# Patient Record
Sex: Male | Born: 1955 | Race: Black or African American | Hispanic: No | Marital: Married | Smoking: Former smoker
Health system: Southern US, Community
[De-identification: ages and names within clinical notes are randomized; demographics above are authoritative.]

## PROBLEM LIST (undated history)

## (undated) DIAGNOSIS — E119 Type 2 diabetes mellitus without complications: Secondary | ICD-10-CM

## (undated) DIAGNOSIS — I10 Essential (primary) hypertension: Secondary | ICD-10-CM

## (undated) DIAGNOSIS — IMO0001 Reserved for inherently not codable concepts without codable children: Secondary | ICD-10-CM

## (undated) HISTORY — PX: NO PAST SURGERIES: SHX2092

## (undated) HISTORY — DX: Essential (primary) hypertension: I10

---

## 2005-03-17 ENCOUNTER — Emergency Department: Payer: Self-pay | Admitting: Emergency Medicine

## 2009-05-31 ENCOUNTER — Emergency Department: Payer: Self-pay | Admitting: Emergency Medicine

## 2010-06-15 ENCOUNTER — Ambulatory Visit: Payer: Self-pay | Admitting: Gastroenterology

## 2010-11-16 ENCOUNTER — Ambulatory Visit: Payer: Self-pay | Admitting: Internal Medicine

## 2012-08-01 ENCOUNTER — Ambulatory Visit: Payer: Self-pay

## 2013-12-16 ENCOUNTER — Ambulatory Visit: Payer: Self-pay

## 2015-06-15 ENCOUNTER — Encounter: Payer: Self-pay | Admitting: *Deleted

## 2015-06-21 ENCOUNTER — Encounter: Payer: Self-pay | Admitting: General Surgery

## 2015-06-21 ENCOUNTER — Ambulatory Visit (INDEPENDENT_AMBULATORY_CARE_PROVIDER_SITE_OTHER): Payer: BLUE CROSS/BLUE SHIELD | Admitting: General Surgery

## 2015-06-21 VITALS — BP 128/70 | HR 72 | Resp 14 | Ht 68.0 in | Wt 221.0 lb

## 2015-06-21 DIAGNOSIS — K4090 Unilateral inguinal hernia, without obstruction or gangrene, not specified as recurrent: Secondary | ICD-10-CM

## 2015-06-21 NOTE — Patient Instructions (Addendum)

## 2015-06-21 NOTE — Progress Notes (Signed)
Patient ID: Lawrence Ross, male   DOB: 1956/02/05, 59 y.o.   MRN: [id number]  Chief Complaint  Patient presents with  . Other    right inguinal hernia    HPI READE Lawrence Ross is a 59 y.o. male here for evaluation of right inguinal hernia. He had an ultrasound done in office on 06/10/15. He reports that this first started on 06/07/15 when he picked up a heavy generator with another individual and they let go of it. He states the next day he noticed a bulging in the right groin area. He states that since he has some pain with coughing or sneezing or when he is standing for long periods. He also feels a thumping pain in his right leg with standing for long periods also. He denies any problems with using the bathroom. He is here today with his wife.  HPI  Past Medical History  Diagnosis Date  . Hypertension     No past surgical history on file.  Family History  Problem Relation Age of Onset  . Diabetes Father   . Hypertension Father   . Fibromyalgia Mother   . Hypertension Mother   . Hypertension Brother     Social History Social History  Substance Use Topics  . Smoking status: Former Smoker -- 20 years    Quit date: 10/24/2003  . Smokeless tobacco: Never Used  . Alcohol Use: 0.0 oz/week    0 Standard drinks or equivalent per week    No Known Allergies  Current Outpatient Prescriptions  Medication Sig Dispense Refill  . amLODipine (NORVASC) 10 MG tablet Take 10 mg by mouth daily.  0  . bisoprolol (ZEBETA) 5 MG tablet Take 5 mg by mouth daily.  0  . diclofenac (VOLTAREN) 75 MG EC tablet Take 75 mg by mouth as needed.  0  . hydrochlorothiazide (HYDRODIURIL) 25 MG tablet Take 25 mg by mouth daily.  0   No current facility-administered medications for this visit.    Review of Systems Review of Systems  Constitutional: Negative.   Respiratory: Negative.   Cardiovascular: Negative.   Gastrointestinal: Negative.     Blood pressure 128/70, pulse 72, resp. rate 14, height 5'  8" (1.727 m), weight 221 lb (100.245 kg).  Physical Exam Physical Exam  Constitutional: He is oriented to person, place, and time. He appears well-developed and well-nourished.  Eyes: Conjunctivae are normal. No scleral icterus.  Neck: Neck supple.  Cardiovascular: Normal rate, regular rhythm and normal heart sounds.   Pulmonary/Chest: Effort normal and breath sounds normal.  Abdominal: Soft. Normal appearance and bowel sounds are normal. There is no tenderness. A hernia is present. Hernia confirmed positive in the right inguinal area. Hernia confirmed negative in the left inguinal area.  Genitourinary: Testes normal and penis normal.     Lymphadenopathy:    He has no cervical adenopathy.       Right: No inguinal adenopathy present.       Left: No inguinal adenopathy present.  Neurological: He is alert and oriented to person, place, and time.  Skin: Skin is warm and dry.  Psychiatric: He has a normal mood and affect.    Data Reviewed Ultrasound from his PCP office dated 06/10/2015 reports apparent bowel containing right inguinal hernia. Clinical correlation recommended. Laboratory studies dated 02/05/2015 showed a blood sugar 138, normal electrolytes, creatinine 0.86 with an estimated GFR of 111. Normal liver function studies. Hemoglobin 14.7 with an MCV of 78, white blood cell count of 8800. Elevated  triglycerides, low HDL, PSA 0.4. TSH 2.36  Assessment    Right inguinal hernia, no evidence of incarceration.    Plan    Indication for elective repair reviewed. The patient may continue his anti-inflammatory prior to surgery.    Hernia precautions and incarceration were discussed with the patient. If they develop symptoms of an incarcerated hernia, they were encouraged to seek prompt medical attention. I have recommended repair of the hernia using mesh on an outpatient basis in the near future. The risk of infection was reviewed. The role of prosthetic mesh to minimize the risk of  recurrence was reviewed.  Patient's surgery has been scheduled for 06-25-15 at [facility].  PCP: Rod Mae 06/21/2015, 4:08 PM

## 2015-06-21 NOTE — H&P (Addendum)
Patient ID: Lawrence Ross, male DOB: 1956-05-26, 59 y.o. MRN: [id number]  Chief Complaint   Patient presents with   .  Other     right inguinal hernia    HPI  Lawrence Ross is a 59 y.o. male here for evaluation of right inguinal hernia. He had an ultrasound done in office on 06/10/15. He reports that this first started on 06/07/15 when he picked up a heavy generator with another individual and they let go of it. He states the next day he noticed a bulging in the right groin area. He states that since he has some pain with coughing or sneezing or when he is standing for long periods. He also feels a thumping pain in his right leg with standing for long periods also. He denies any problems with using the bathroom. He is here today with his wife.  HPI  Past Medical History   Diagnosis  Date   .  Hypertension     No past surgical history on file.  Family History   Problem  Relation  Age of Onset   .  Diabetes  Father    .  Hypertension  Father    .  Fibromyalgia  Mother    .  Hypertension  Mother    .  Hypertension  Brother     Social History  Social History   Substance Use Topics   .  Smoking status:  Former Smoker -- 20 years     Quit date:  10/24/2003   .  Smokeless tobacco:  Never Used   .  Alcohol Use:  0.0 oz/week     0 Standard drinks or equivalent per week    No Known Allergies  Current Outpatient Prescriptions   Medication  Sig  Dispense  Refill   .  amLODipine (NORVASC) 10 MG tablet  Take 10 mg by mouth daily.   0   .  bisoprolol (ZEBETA) 5 MG tablet  Take 5 mg by mouth daily.   0   .  diclofenac (VOLTAREN) 75 MG EC tablet  Take 75 mg by mouth as needed.   0   .  hydrochlorothiazide (HYDRODIURIL) 25 MG tablet  Take 25 mg by mouth daily.   0    No current facility-administered medications for this visit.    Review of Systems  Review of Systems  Constitutional: Negative.  Respiratory: Negative.  Cardiovascular: Negative.  Gastrointestinal: Negative.   Blood pressure  128/70, pulse 72, resp. rate 14, height  (1.727 m), weight 221 lb (100.245 kg).  Physical Exam  Physical Exam  Constitutional: He is oriented to person, place, and time. He appears well-developed and well-nourished.  Eyes: Conjunctivae are normal. No scleral icterus.  Neck: Neck supple.  Cardiovascular: Normal rate, regular rhythm and normal heart sounds.  Pulmonary/Chest: Effort normal and breath sounds normal.  Abdominal: Soft. Normal appearance and bowel sounds are normal. There is no tenderness. A hernia is present. Hernia confirmed positive in the right inguinal area. Hernia confirmed negative in the left inguinal area.  Genitourinary: Testes normal and penis normal.     Lymphadenopathy:  He has no cervical adenopathy.  Right: No inguinal adenopathy present.  Left: No inguinal adenopathy present.  Neurological: He is alert and oriented to person, place, and time.  Skin: Skin is warm and dry.  Psychiatric: He has a normal mood and affect.   Data Reviewed  Ultrasound from his PCP office dated 06/10/2015 reports apparent bowel containing right  inguinal hernia. Clinical correlation recommended.  Laboratory studies dated 02/05/2015 showed a blood sugar 138, normal electrolytes, creatinine 0.86 with an estimated GFR of 111. Normal liver function studies. Hemoglobin 14.7 with an MCV of 78, white blood cell count of 8800. Elevated triglycerides, low HDL, PSA 0.4. TSH 2.36 Assessment   Right inguinal hernia, no evidence of incarceration.   Plan   Indication for elective repair reviewed. The patient may continue his anti-inflammatory prior to surgery.   Hernia precautions and incarceration were discussed with the patient. If they develop symptoms of an incarcerated hernia, they were encouraged to seek prompt medical attention.  I have recommended repair of the hernia using mesh on an outpatient basis in the near future. The risk of infection was reviewed. The role of prosthetic mesh  to minimize the risk of recurrence was reviewed.  Patient's surgery has been scheduled for 06-25-15 at [facility].  PCP: Rod Mae

## 2015-06-23 ENCOUNTER — Encounter: Payer: Self-pay | Admitting: *Deleted

## 2015-06-23 ENCOUNTER — Other Ambulatory Visit: Payer: Self-pay

## 2015-06-23 DIAGNOSIS — Z87891 Personal history of nicotine dependence: Secondary | ICD-10-CM | POA: Diagnosis not present

## 2015-06-23 DIAGNOSIS — E119 Type 2 diabetes mellitus without complications: Secondary | ICD-10-CM | POA: Diagnosis not present

## 2015-06-23 DIAGNOSIS — I10 Essential (primary) hypertension: Secondary | ICD-10-CM | POA: Diagnosis not present

## 2015-06-23 DIAGNOSIS — K4090 Unilateral inguinal hernia, without obstruction or gangrene, not specified as recurrent: Secondary | ICD-10-CM | POA: Diagnosis present

## 2015-06-23 DIAGNOSIS — IMO0001 Reserved for inherently not codable concepts without codable children: Secondary | ICD-10-CM

## 2015-06-23 HISTORY — DX: Reserved for inherently not codable concepts without codable children: IMO0001

## 2015-06-23 NOTE — Patient Instructions (Signed)
  Your procedure is scheduled on: 06-25-15 Report to [facility] 2ND FLOOR To find out your arrival time please call [phone] between 1PM - 3PM on 06-24-15  Remember: Instructions that are not followed completely may result in serious medical risk, up to and including death, or upon the discretion of your surgeon and anesthesiologist your surgery may need to be rescheduled.    _X__ 1. Do not eat food or drink liquids after midnight. No gum chewing or hard candies.     _X___ 2. No Alcohol for 24 hours before or after surgery.   ____ 3. Bring all medications with you on the day of surgery if instructed.    _X___ 4. Notify your doctor if there is any change in your medical condition     (cold, fever, infections).     Do not wear jewelry, make-up, hairpins, clips or nail polish.  Do not wear lotions, powders, or perfumes. You may wear deodorant.  Do not shave 48 hours prior to surgery. Men may shave face and neck.  Do not bring valuables to the hospital.    [facility] is not responsible for any belongings or valuables.               Contacts, dentures or bridgework may not be worn into surgery.  Leave your suitcase in the car. After surgery it may be brought to your room.  For patients admitted to the hospital, discharge time is determined by your  treatment team.   Patients discharged the day of surgery will not be allowed to drive home.   Please read over the following fact sheets that you were given:      __X__ Take these medicines the morning of surgery with A SIP OF WATER:    1. BISOPROLOL  2. AMLODIPINE  3.   4.  5.  6.  ____ Fleet Enema (as directed)   __X__ Use CHG Soap as directed  ____ Use inhalers on the day of surgery  ____ Stop metformin 2 days prior to surgery    ____ Take 1/2 of usual insulin dose the night before surgery and none on the morning of surgery.   ____ Stop Coumadin/Plavix/aspirin-N/A  ____ Stop Anti-inflammatories-OK TO  CONTINUE DICLOFENAC PER DR BYRNETT   ____ Stop supplements until after surgery.    ____ Bring C-Pap to the hospital.

## 2015-06-24 ENCOUNTER — Encounter: Payer: Self-pay | Admitting: *Deleted

## 2015-06-24 ENCOUNTER — Encounter
Admission: RE | Admit: 2015-06-24 | Discharge: 2015-06-24 | Disposition: A | Discharge status: Home / Self Care | Payer: BLUE CROSS/BLUE SHIELD | Source: Ambulatory Visit | Attending: Anesthesiology | Admitting: Anesthesiology

## 2015-06-24 DIAGNOSIS — I10 Essential (primary) hypertension: Secondary | ICD-10-CM | POA: Diagnosis not present

## 2015-06-24 DIAGNOSIS — K4090 Unilateral inguinal hernia, without obstruction or gangrene, not specified as recurrent: Secondary | ICD-10-CM | POA: Diagnosis not present

## 2015-06-24 LAB — POTASSIUM: POTASSIUM: 3.7 mmol/L (ref 3.5–5.1)

## 2015-06-25 ENCOUNTER — Ambulatory Visit
Admission: RE | Admit: 2015-06-25 | Discharge: 2015-06-25 | Disposition: A | Discharge status: Home / Self Care | Payer: BLUE CROSS/BLUE SHIELD | Source: Ambulatory Visit | Attending: General Surgery | Admitting: General Surgery

## 2015-06-25 ENCOUNTER — Ambulatory Visit: Payer: BLUE CROSS/BLUE SHIELD | Admitting: Anesthesiology

## 2015-06-25 ENCOUNTER — Encounter
Admission: RE | Disposition: A | Discharge status: Home / Self Care | Payer: Self-pay | Source: Ambulatory Visit | Attending: General Surgery

## 2015-06-25 ENCOUNTER — Encounter: Payer: Self-pay | Admitting: *Deleted

## 2015-06-25 DIAGNOSIS — K4090 Unilateral inguinal hernia, without obstruction or gangrene, not specified as recurrent: Secondary | ICD-10-CM | POA: Diagnosis not present

## 2015-06-25 DIAGNOSIS — I10 Essential (primary) hypertension: Secondary | ICD-10-CM | POA: Insufficient documentation

## 2015-06-25 DIAGNOSIS — E119 Type 2 diabetes mellitus without complications: Secondary | ICD-10-CM | POA: Insufficient documentation

## 2015-06-25 DIAGNOSIS — Z87891 Personal history of nicotine dependence: Secondary | ICD-10-CM | POA: Insufficient documentation

## 2015-06-25 HISTORY — DX: Type 2 diabetes mellitus without complications: E11.9

## 2015-06-25 HISTORY — DX: Reserved for inherently not codable concepts without codable children: IMO0001

## 2015-06-25 HISTORY — PX: HERNIA REPAIR: SHX51

## 2015-06-25 HISTORY — PX: INGUINAL HERNIA REPAIR: SHX194

## 2015-06-25 SURGERY — REPAIR, HERNIA, INGUINAL, ADULT
Anesthesia: General | Laterality: Right | Wound class: Clean

## 2015-06-25 MED ORDER — FENTANYL CITRATE (PF) 100 MCG/2ML IJ SOLN: 25.0000 ug | INTRAMUSCULAR | Status: DC | PRN

## 2015-06-25 MED ORDER — ACETAMINOPHEN 10 MG/ML IV SOLN
INTRAVENOUS | Status: AC
  Filled 2015-06-25: qty 100

## 2015-06-25 MED ORDER — KETOROLAC TROMETHAMINE 30 MG/ML IJ SOLN
INTRAMUSCULAR | Status: DC | PRN
  Administered 2015-06-25: 30 mg

## 2015-06-25 MED ORDER — FAMOTIDINE 20 MG PO TABS
20.0000 mg | ORAL_TABLET | Freq: Once | ORAL | Status: AC
  Administered 2015-06-25: 20 mg via ORAL

## 2015-06-25 MED ORDER — FENTANYL CITRATE (PF) 100 MCG/2ML IJ SOLN
INTRAMUSCULAR | Status: DC | PRN
  Administered 2015-06-25: 50 ug via INTRAVENOUS

## 2015-06-25 MED ORDER — GLYCOPYRROLATE 0.2 MG/ML IJ SOLN
INTRAMUSCULAR | Status: DC | PRN
  Administered 2015-06-25: 0.2 mg via INTRAVENOUS

## 2015-06-25 MED ORDER — DEXAMETHASONE SODIUM PHOSPHATE 4 MG/ML IJ SOLN
INTRAMUSCULAR | Status: DC | PRN
  Administered 2015-06-25: 10 mg via INTRAVENOUS

## 2015-06-25 MED ORDER — FAMOTIDINE 20 MG PO TABS
ORAL_TABLET | ORAL | Status: AC
  Administered 2015-06-25: 20 mg via ORAL
  Filled 2015-06-25: qty 1

## 2015-06-25 MED ORDER — ONDANSETRON HCL 4 MG/2ML IJ SOLN: 4.0000 mg | Freq: Once | INTRAMUSCULAR | Status: DC | PRN

## 2015-06-25 MED ORDER — HYDROCODONE-ACETAMINOPHEN 5-325 MG PO TABS
ORAL_TABLET | ORAL | Status: AC
  Administered 2015-06-25: 1 via ORAL
  Filled 2015-06-25: qty 1

## 2015-06-25 MED ORDER — ACETAMINOPHEN 10 MG/ML IV SOLN
INTRAVENOUS | Status: DC | PRN
  Administered 2015-06-25: 1000 mg via INTRAVENOUS

## 2015-06-25 MED ORDER — HYDROCODONE-ACETAMINOPHEN 5-325 MG PO TABS
1.0000 | ORAL_TABLET | ORAL | Status: DC | PRN
  Administered 2015-06-25: 1 via ORAL

## 2015-06-25 MED ORDER — HYDROCODONE-ACETAMINOPHEN 5-325 MG PO TABS: 1.0000 | ORAL_TABLET | ORAL | Status: AC | PRN

## 2015-06-25 MED ORDER — LIDOCAINE HCL (CARDIAC) 20 MG/ML IV SOLN
INTRAVENOUS | Status: DC | PRN
  Administered 2015-06-25: 100 mg via INTRAVENOUS

## 2015-06-25 MED ORDER — CEFAZOLIN SODIUM-DEXTROSE 2-3 GM-% IV SOLR
2.0000 g | INTRAVENOUS | Status: AC
  Administered 2015-06-25: 2 g via INTRAVENOUS

## 2015-06-25 MED ORDER — ONDANSETRON HCL 4 MG/2ML IJ SOLN
INTRAMUSCULAR | Status: DC | PRN
  Administered 2015-06-25: 4 mg via INTRAVENOUS

## 2015-06-25 MED ORDER — BUPIVACAINE-EPINEPHRINE (PF) 0.5% -1:200000 IJ SOLN
INTRAMUSCULAR | Status: DC | PRN
  Administered 2015-06-25: 30 mL

## 2015-06-25 MED ORDER — SODIUM CHLORIDE 0.9 % IV SOLN
INTRAVENOUS | Status: DC
Start: 2015-06-25 — End: 2015-06-25
  Administered 2015-06-25: 07:00:00 via INTRAVENOUS

## 2015-06-25 MED ORDER — MIDAZOLAM HCL 2 MG/2ML IJ SOLN
INTRAMUSCULAR | Status: DC | PRN
  Administered 2015-06-25: 2 mg via INTRAVENOUS

## 2015-06-25 MED ORDER — BUPIVACAINE-EPINEPHRINE (PF) 0.5% -1:200000 IJ SOLN
INTRAMUSCULAR | Status: AC
  Filled 2015-06-25: qty 30

## 2015-06-25 MED ORDER — PROPOFOL 10 MG/ML IV BOLUS
INTRAVENOUS | Status: DC | PRN
  Administered 2015-06-25: 200 mg via INTRAVENOUS

## 2015-06-25 MED ORDER — CEFAZOLIN SODIUM-DEXTROSE 2-3 GM-% IV SOLR
INTRAVENOUS | Status: AC
  Administered 2015-06-25: 2 g via INTRAVENOUS
  Filled 2015-06-25: qty 50

## 2015-06-25 SURGICAL SUPPLY — 34 items
BENZOIN TINCTURE PRP APPL 2/3 (GAUZE/BANDAGES/DRESSINGS) ×3 IMPLANT
BLADE SURG 15 STRL SS SAFETY (BLADE) ×3 IMPLANT
CANISTER SUCT 1200ML W/VALVE (MISCELLANEOUS) ×3 IMPLANT
CHLORAPREP W/TINT 26ML (MISCELLANEOUS) ×3 IMPLANT
CLOSURE WOUND 1/2 X4 (GAUZE/BANDAGES/DRESSINGS) ×1
DECANTER SPIKE VIAL GLASS SM (MISCELLANEOUS) IMPLANT
DRAIN PENROSE 1/4X12 LTX (DRAIN) ×3 IMPLANT
DRAPE LAPAROTOMY 100X77 ABD (DRAPES) ×3 IMPLANT
DRESSING TELFA 4X3 1S ST N-ADH (GAUZE/BANDAGES/DRESSINGS) ×3 IMPLANT
DRSG TEGADERM 4X4.75 (GAUZE/BANDAGES/DRESSINGS) ×3 IMPLANT
DRSG TELFA 3X8 NADH (GAUZE/BANDAGES/DRESSINGS) ×3 IMPLANT
GLOVE BIO SURGEON STRL SZ7.5 (GLOVE) ×3 IMPLANT
GLOVE INDICATOR 8.0 STRL GRN (GLOVE) ×3 IMPLANT
GOWN STRL REUS W/ TWL LRG LVL3 (GOWN DISPOSABLE) ×2 IMPLANT
GOWN STRL REUS W/TWL LRG LVL3 (GOWN DISPOSABLE) ×4
KIT RM TURNOVER STRD PROC AR (KITS) ×3 IMPLANT
LABEL OR SOLS (LABEL) ×3 IMPLANT
MESH HERNIA SYS ULTRAPRO LRG (Mesh General) ×3 IMPLANT
NDL SAFETY 22GX1.5 (NEEDLE) ×6 IMPLANT
NEEDLE HYPO 25X1 1.5 SAFETY (NEEDLE) ×3 IMPLANT
PACK BASIN MINOR ARMC (MISCELLANEOUS) ×3 IMPLANT
PAD GROUND ADULT SPLIT (MISCELLANEOUS) ×3 IMPLANT
STRIP CLOSURE SKIN 1/2X4 (GAUZE/BANDAGES/DRESSINGS) ×2 IMPLANT
SUT SURGILON 0 BLK (SUTURE) ×6 IMPLANT
SUT VIC AB 2-0 SH 27 (SUTURE) ×2
SUT VIC AB 2-0 SH 27XBRD (SUTURE) ×1 IMPLANT
SUT VIC AB 3-0 54X BRD REEL (SUTURE) ×1 IMPLANT
SUT VIC AB 3-0 BRD 54 (SUTURE) ×2
SUT VIC AB 3-0 SH 27 (SUTURE) ×2
SUT VIC AB 3-0 SH 27X BRD (SUTURE) ×1 IMPLANT
SUT VIC AB 4-0 FS2 27 (SUTURE) ×3 IMPLANT
SWABSTK COMLB BENZOIN TINCTURE (MISCELLANEOUS) ×3 IMPLANT
SYR 3ML LL SCALE MARK (SYRINGE) ×3 IMPLANT
SYR CONTROL 10ML (SYRINGE) ×3 IMPLANT

## 2015-06-25 NOTE — H&P (Signed)
Mild URI symptoms earlier in the week have resolved. Lungs:Clear. Cardio: RR. Plan: Right inguinal hernia repair.

## 2015-06-25 NOTE — Anesthesia Procedure Notes (Signed)
Procedure Name: LMA Insertion Date/Time: 06/25/2015 7:33 AM Performed by: Stormy Fabian Pre-anesthesia Checklist: Patient identified, Patient being monitored, Timeout performed, Emergency Drugs available and Suction available Patient Re-evaluated:Patient Re-evaluated prior to inductionOxygen Delivery Method: Circle system utilized Preoxygenation: Pre-oxygenation with 100% oxygen Intubation Type: IV induction Ventilation: Mask ventilation without difficulty LMA: LMA inserted LMA Size: 4.5 Tube type: Oral Number of attempts: 1 Placement Confirmation: positive ETCO2 and breath sounds checked- equal and bilateral Tube secured with: Tape Dental Injury: Teeth and Oropharynx as per pre-operative assessment

## 2015-06-25 NOTE — Op Note (Signed)
Preoperative diagnosis: Symptomatic right inguinal hernia.  Postoperative diagnosis: Same.  Operative procedure: Right inguinal hernia repair, indirect with Ultra Pro mesh, large.  Operating surgeon: Lane Hacker, M.D.  Anesthesia: Gen., Marcaine 0.5% with 1-200,000 of epinephrine, 30 mL; portal 30 mg.  Estimated blood loss: Less than 5 mL.  Clinical note: This 59 year old male developed symptomatically right inguinal hernia. He is made for elective repair.  Operative note: The patient received Kefzol prior to procedure and air was removed from the surgical site with clippers. After induction of general anesthesia the area was prepped with DuraPrep and draped. A 5 cm skin incision along the anticipated course inguinal canal was carried out to skin septum is tissue with hemostasis achieved bilateral cautery and 30 Vicryls ties. The external oblique was opened in the direction of its fibers. A lipoma of the cord was excised and discarded. A sizable indirect hernia sac with evidence of inflammation was noted and dissected free of the cord structures. This was carried back into the preperitoneal space. Palpation here showed a 1 cm firm external iliac lymph node that was removed and sent for routine histology. The preperitoneal space was cleared and a large Ultra Pro mesh was smoothed into position. A lateral slit was made for cord passage and closed with interrupted 0 Surgilon sutures. The mesh was anchored to the inguinal ligament with interrupted oh Surgilon. The medial and superior borders were anchored to the transverse abdominis aponeurosis in a similar fashion. The ilioinguinal and iliohypogastric nerves were identified and protected. Toradol was placed into the wound. Field block anesthesia was placed for postoperative analgesia. The external oblique was closed with a running 2-0 Vicryls suture. The Scarpa's fascia was closed with a running 3-0 Vicryls suture. The skin was closed with a running  4-0 Vicryls suture. Benzoin, Steri-Strips, Telfa and Tegaderm dressings were applied.  The patient tolerated the procedure well and was taken to recovery in stable condition

## 2015-06-25 NOTE — Anesthesia Preprocedure Evaluation (Addendum)
Anesthesia Evaluation  Patient identified by MRN, date of birth, ID band Patient awake    Reviewed: Allergy & Precautions, NPO status , Patient's Chart, lab work & pertinent test results, reviewed documented beta blocker date and time   Airway Mallampati: III  TM Distance: >3 FB     Dental  (+) Chipped   Pulmonary former smoker,          Cardiovascular hypertension, Pt. on medications and Pt. on home beta blockers     Neuro/Psych    GI/Hepatic   Endo/Other  diabetes, Type 2  Renal/GU      Musculoskeletal   Abdominal   Peds  Hematology   Anesthesia Other Findings Had cold last week - ok now.  Reproductive/Obstetrics                            Anesthesia Physical Anesthesia Plan  ASA: III  Anesthesia Plan: General   Post-op Pain Management:    Induction: Intravenous  Airway Management Planned: LMA  Additional Equipment:   Intra-op Plan:   Post-operative Plan:   Informed Consent: I have reviewed the patients History and Physical, chart, labs and discussed the procedure including the risks, benefits and alternatives for the proposed anesthesia with the patient or authorized representative who has indicated his/her understanding and acceptance.     Plan Discussed with: CRNA  Anesthesia Plan Comments:         Anesthesia Quick Evaluation

## 2015-06-25 NOTE — Discharge Instructions (Signed)

## 2015-06-25 NOTE — Transfer of Care (Signed)
Immediate Anesthesia Transfer of Care Note  Patient: Lawrence Ross  Procedure(s) Performed: Procedure(s): RIGHT INGUINAL HERNIA REPAIR ADULT (Right)  Patient Location: PACU  Anesthesia Type:General  Level of Consciousness: sedated  Airway & Oxygen Therapy: Patient Spontanous Breathing and Patient connected to face mask oxygen  Post-op Assessment: Report given to RN and Post -op Vital signs reviewed and stable  Post vital signs: Reviewed and stable  Last Vitals:  Filed Vitals:   06/25/15 0847  BP: 120/72  Pulse: 78  Temp: 37 C  Resp: 15    Complications: No apparent anesthesia complications

## 2015-06-25 NOTE — Anesthesia Postprocedure Evaluation (Signed)
  Anesthesia Post-op Note  Patient: Lawrence Ross  Procedure(s) Performed: Procedure(s): RIGHT INGUINAL HERNIA REPAIR ADULT (Right)  Anesthesia type:General  Patient location: PACU  Post pain: Pain level controlled  Post assessment: Post-op Vital signs reviewed, Patient's Cardiovascular Status Stable, Respiratory Function Stable, Patent Airway and No signs of Nausea or vomiting  Post vital signs: Reviewed and stable  Last Vitals:  Filed Vitals:   06/25/15 1115  BP: 142/78  Pulse: 59  Temp:   Resp: 16    Level of consciousness: awake, alert  and patient cooperative  Complications: No apparent anesthesia complications

## 2015-06-25 NOTE — Brief Op Note (Signed)
06/25/2015  8:41 AM  PATIENT:  Royal Hawthorn Lamping  59 y.o. male  PRE-OPERATIVE DIAGNOSIS:  RIGHT INGUINAL HERNIA  POST-OPERATIVE DIAGNOSIS:  right inguinal hernia   PROCEDURE:  Procedure(s): RIGHT INGUINAL HERNIA REPAIR ADULT (Right)  SURGEON:  Surgeon(s) and Role:gen   * Earline Mayotte, MD - Primary  PHYSICIAN ASSISTANT:   ASSISTANTS: none   ANESTHESIA:   general  EBL:     BLOOD ADMINISTERED:none  DRAINS: none   LOCAL MEDICATIONS USED:  MARCAINE     SPECIMEN:  Source of Specimen:  ext iliac node  DISPOSITION OF SPECIMEN:  PATHOLOGY  COUNTS:  YES   DICTATION: .Dragon Dictation  PLAN OF CARE: Discharge to home after PACU  PATIENT DISPOSITION:  PACU - hemodynamically stable.   Delay start of Pharmacological VTE agent (>24hrs) due to surgical blood loss or risk of bleeding: not applicable

## 2015-06-25 NOTE — Progress Notes (Signed)
Pt awakened oral airway removed intact. 

## 2015-06-29 ENCOUNTER — Encounter: Payer: Self-pay | Admitting: General Surgery

## 2015-06-29 LAB — SURGICAL PATHOLOGY

## 2015-06-30 ENCOUNTER — Encounter: Payer: Self-pay | Admitting: General Surgery

## 2015-07-05 ENCOUNTER — Ambulatory Visit (INDEPENDENT_AMBULATORY_CARE_PROVIDER_SITE_OTHER): Payer: BLUE CROSS/BLUE SHIELD | Admitting: General Surgery

## 2015-07-05 ENCOUNTER — Encounter: Payer: Self-pay | Admitting: General Surgery

## 2015-07-05 VITALS — Ht 68.0 in | Wt 219.2 lb

## 2015-07-05 DIAGNOSIS — K4090 Unilateral inguinal hernia, without obstruction or gangrene, not specified as recurrent: Secondary | ICD-10-CM

## 2015-07-05 NOTE — Progress Notes (Signed)
Patient ID: Lawrence Ross, male   DOB: 09-Mar-1956, 59 y.o.   MRN: [id number]  Chief Complaint  Patient presents with  . Routine Post Op    hernia repair , 06-25-15    HPI Lawrence Ross is a 59 y.o. male.  Post Op Hernia repair on 06-25-15.  Having some pain since surgery. Having some constipation which is controlled using a "laxative tea" that his wife bought.  Feels "funny" sensation when moving. HPI  Past Medical History  Diagnosis Date  . Hypertension   . Diabetes mellitus without complication     BORDERLINE  . Cold 06-23-15    INSTRUCTED TO CALL DR BYRNETTS OFFICE AND NOTIFY THEM OF THIS DUE TO UPCOMING SURGERY    Past Surgical History  Procedure Laterality Date  . No past surgeries    . Inguinal hernia repair Right 06/25/2015    Procedure: RIGHT INGUINAL HERNIA REPAIR ADULT;  Surgeon: Earline Mayotte, MD;  Location: [facility] ORS;  Service: General;  Laterality: Right;  . Hernia repair Right 06/25/2015    Indirect hernia repair with large Ultra Pro mesh    Family History  Problem Relation Age of Onset  . Diabetes Father   . Hypertension Father   . Fibromyalgia Mother   . Hypertension Mother   . Hypertension Brother     Social History Social History  Substance Use Topics  . Smoking status: Former Smoker -- 20 years    Types: Cigarettes    Quit date: 10/24/2003  . Smokeless tobacco: Never Used  . Alcohol Use: 0.0 oz/week    0 Standard drinks or equivalent per week     Comment: OCC    No Known Allergies  Current Outpatient Prescriptions  Medication Sig Dispense Refill  . amLODipine (NORVASC) 10 MG tablet Take 10 mg by mouth every morning.   0  . bisoprolol (ZEBETA) 5 MG tablet Take 5 mg by mouth every morning.   0  . diclofenac (VOLTAREN) 75 MG EC tablet Take 75 mg by mouth as needed.  0  . hydrochlorothiazide (HYDRODIURIL) 25 MG tablet Take 25 mg by mouth daily.  0  . HYDROcodone-acetaminophen (NORCO) 5-325 MG per tablet Take 1-2 tablets by mouth every 4 (four)  hours as needed for moderate pain or severe pain. 30 tablet 0  . Doxylamine-Phenylephrine-APAP (VICKS NYQUIL SINUS PO) Take 1 tablet by mouth as needed.    . Pseudoeph-Doxylamine-DM-APAP (NYQUIL PO) Take by mouth.     No current facility-administered medications for this visit.    Review of Systems Review of Systems  Constitutional: Negative.   Respiratory: Negative.   Gastrointestinal: Negative.     Height  (1.727 m), weight 219 lb 3.2 oz (99.428 kg).  Physical Exam Physical Exam  Abdominal:        Assessment    Doing well status post right inguinal hernia repair.    Plan    The patient will increase his activity as is comfortable. We'll plan for a follow-up examination here on an as-needed basis.       Earline Mayotte 07/05/2015, 9:40 PM

## 2015-08-14 IMAGING — CR RIGHT FOOT COMPLETE - 3+ VIEW
1 series · 3 of 3 positions shown · non-contrast
Comparison: 08/01/2012

CLINICAL DATA: Right foot and heel pain, no history of trauma

EXAM:
RIGHT FOOT COMPLETE - 3+ VIEW

[Series 1: ap · 0.17mm/px · 3 of 3 slices shown]
[im 1/3]
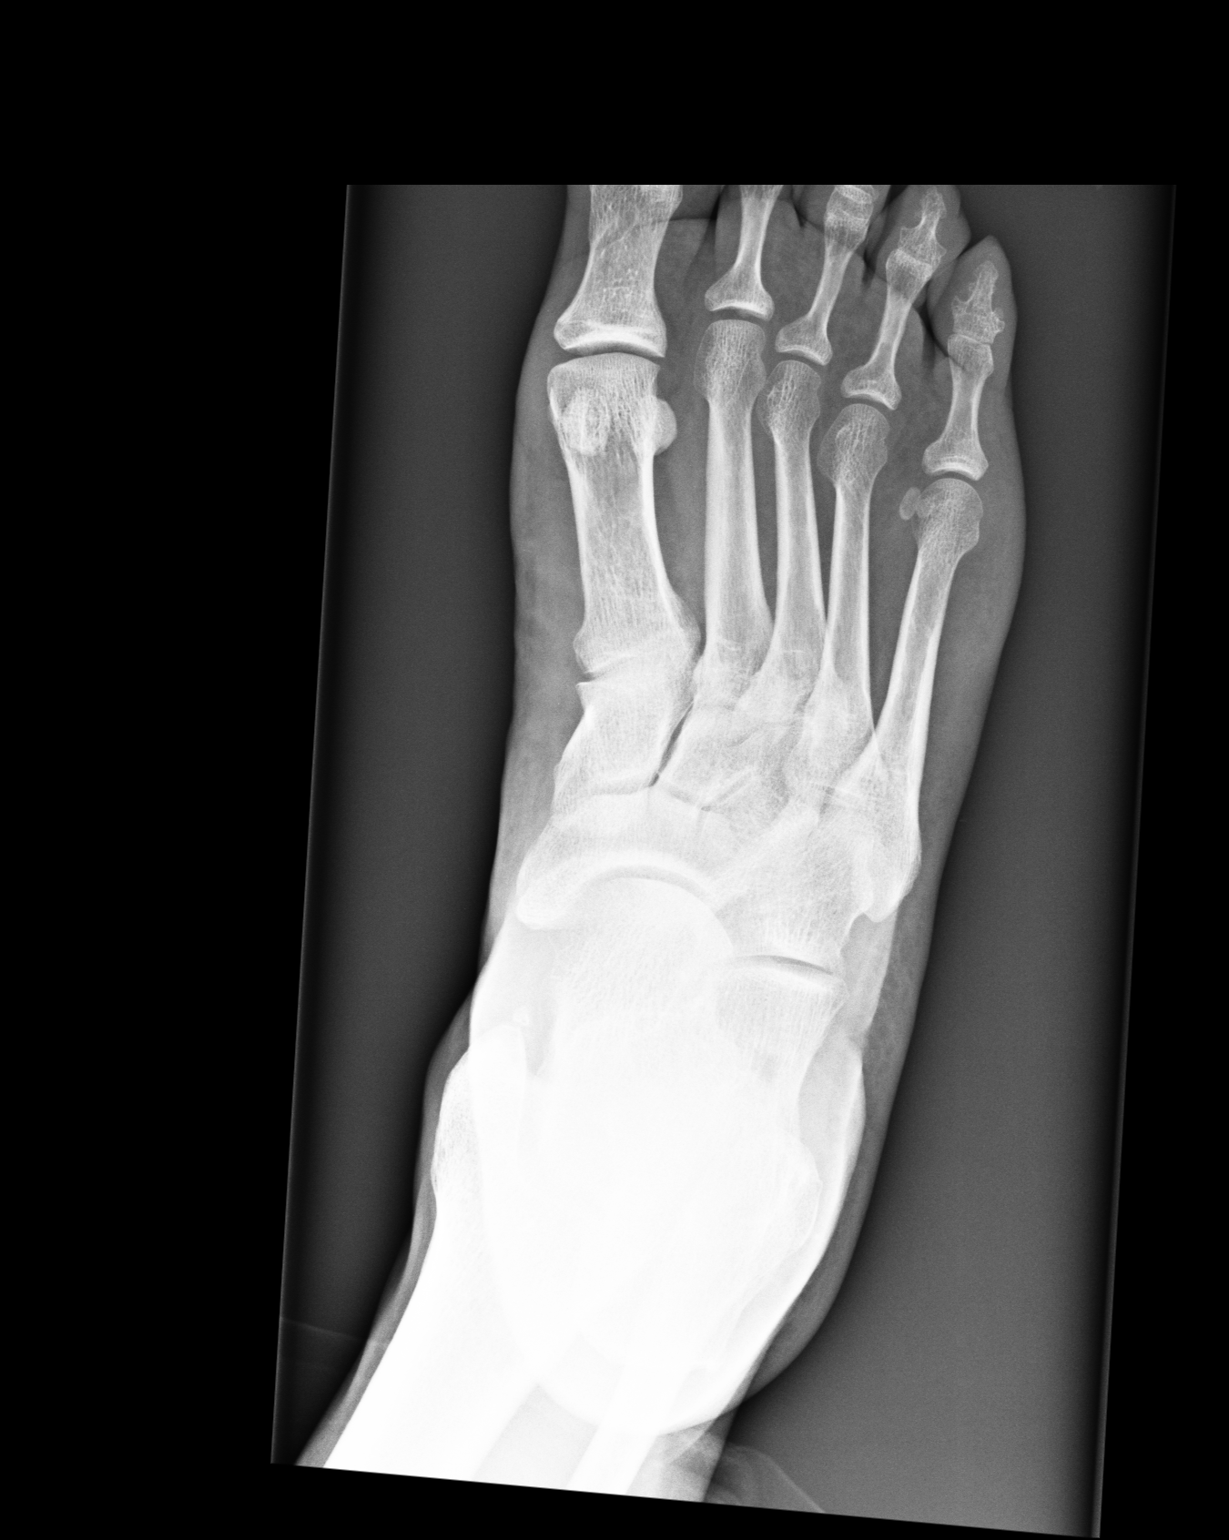
[im 2/3]
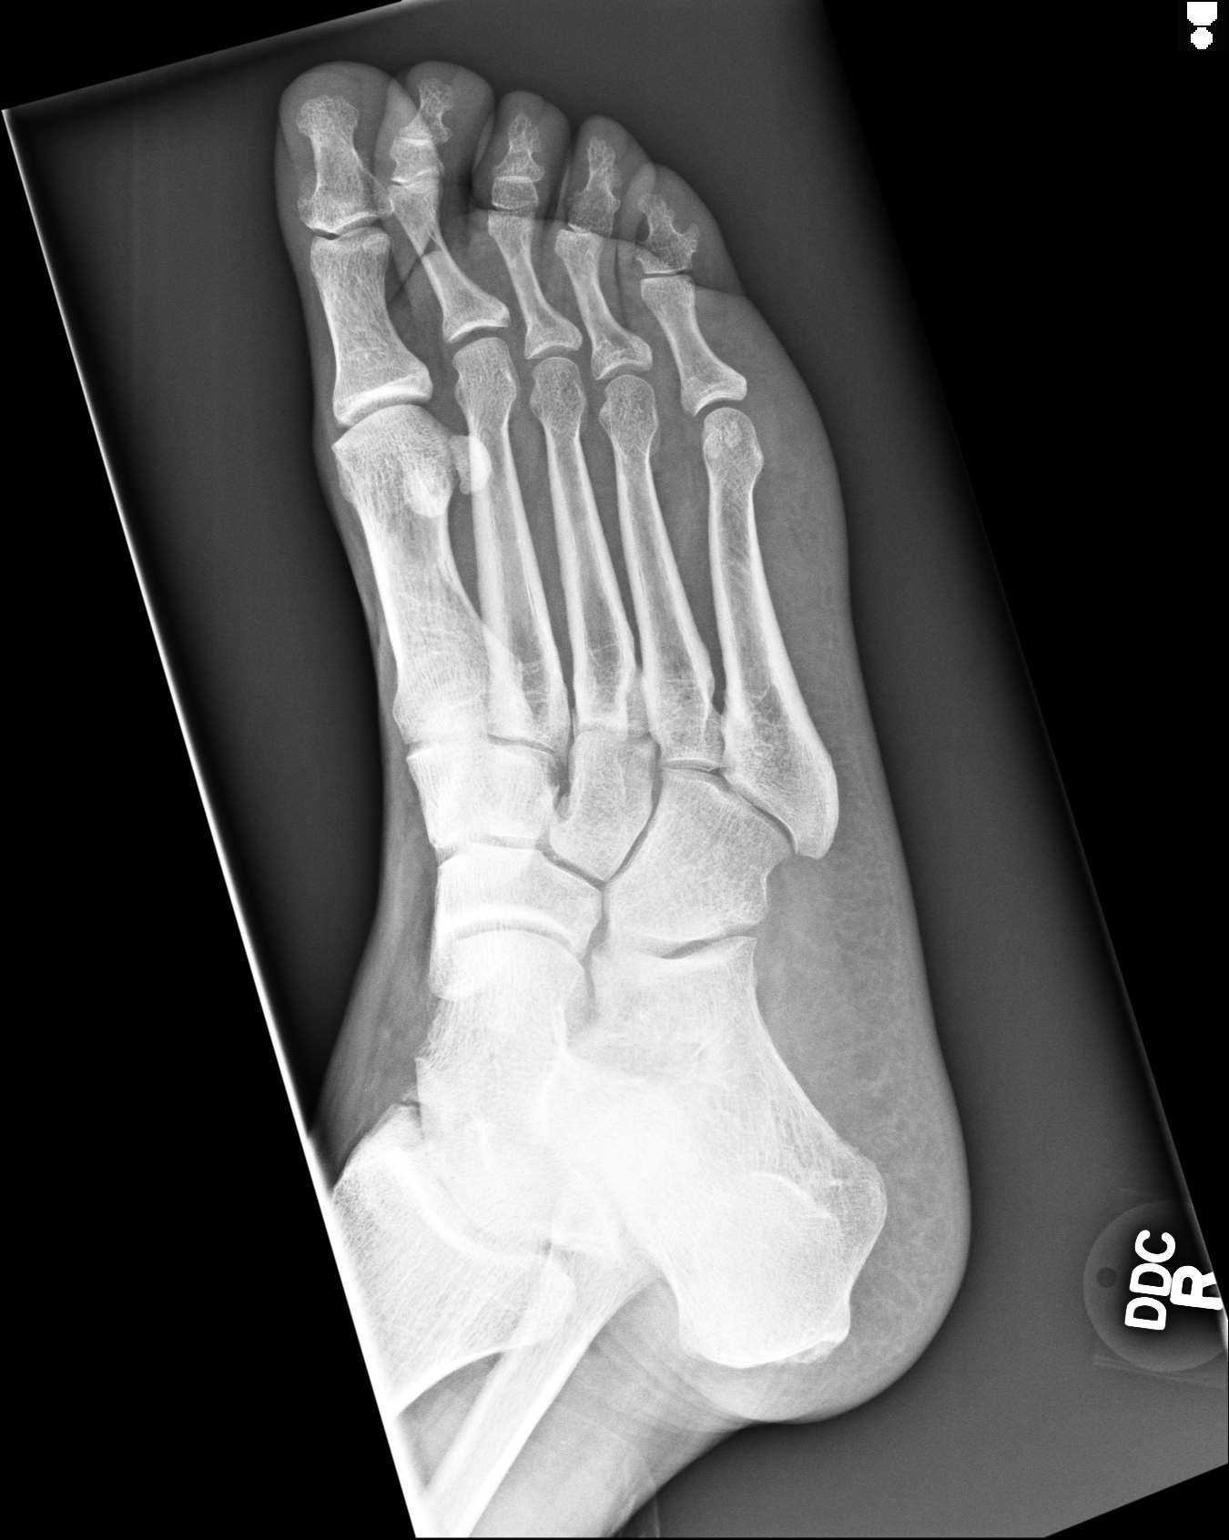
[im 3/3]
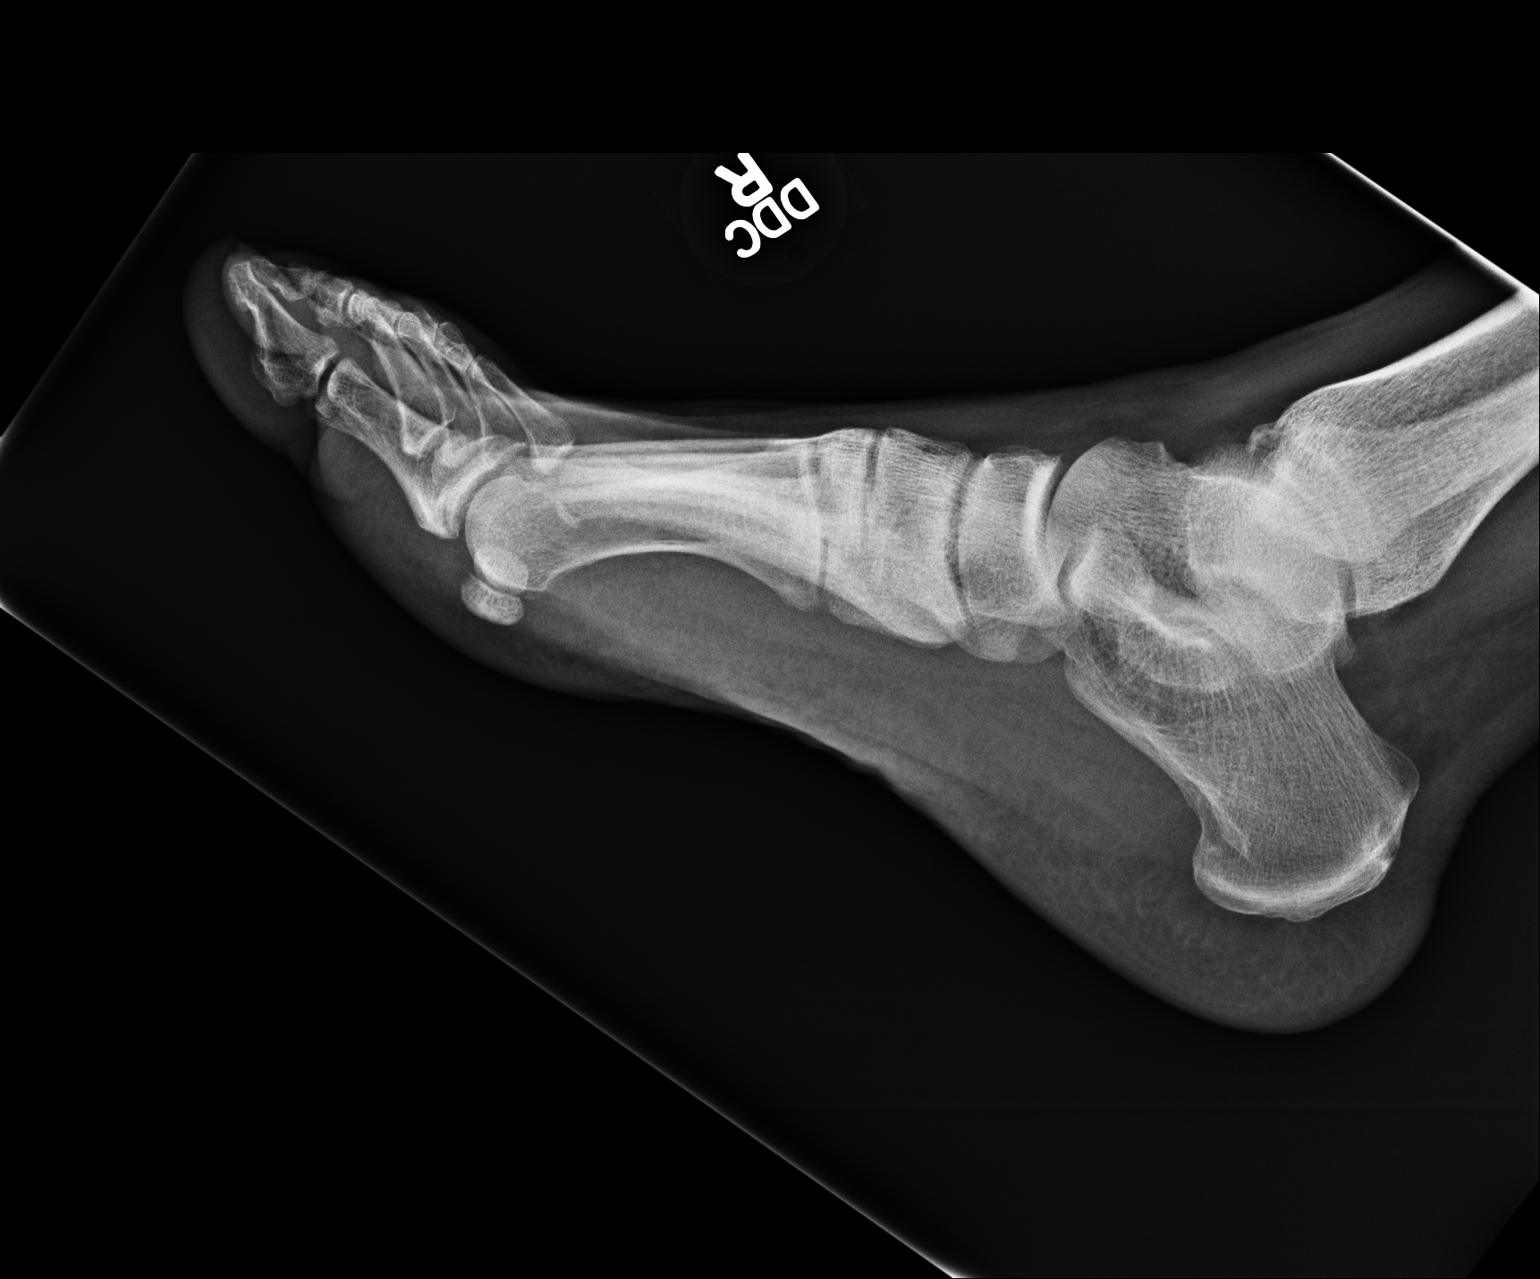

[3 of 3 positions shown; findings below may reference images not displayed]

FINDINGS: Osseous mineralization normal.

Joint spaces preserved.

No acute fracture, dislocation or bone destruction.
IMPRESSION: No acute abnormalities.

## 2015-11-16 ENCOUNTER — Ambulatory Visit
Admission: RE | Admit: 2015-11-16 | Discharge: 2015-11-16 | Disposition: A | Discharge status: Home / Self Care | Payer: BLUE CROSS/BLUE SHIELD | Source: Ambulatory Visit | Attending: Nurse Practitioner | Admitting: Nurse Practitioner

## 2015-11-16 ENCOUNTER — Other Ambulatory Visit: Payer: Self-pay | Admitting: Nurse Practitioner

## 2015-11-16 DIAGNOSIS — M25561 Pain in right knee: Secondary | ICD-10-CM | POA: Insufficient documentation

## 2016-02-21 ENCOUNTER — Encounter: Payer: Self-pay | Admitting: *Deleted

## 2016-02-21 ENCOUNTER — Emergency Department: Payer: BLUE CROSS/BLUE SHIELD

## 2016-02-21 DIAGNOSIS — I10 Essential (primary) hypertension: Secondary | ICD-10-CM | POA: Insufficient documentation

## 2016-02-21 DIAGNOSIS — E119 Type 2 diabetes mellitus without complications: Secondary | ICD-10-CM | POA: Diagnosis not present

## 2016-02-21 DIAGNOSIS — R2241 Localized swelling, mass and lump, right lower limb: Secondary | ICD-10-CM | POA: Diagnosis not present

## 2016-02-21 DIAGNOSIS — M79671 Pain in right foot: Secondary | ICD-10-CM | POA: Diagnosis present

## 2016-02-21 DIAGNOSIS — Z87891 Personal history of nicotine dependence: Secondary | ICD-10-CM | POA: Diagnosis not present

## 2016-02-21 NOTE — ED Notes (Addendum)
Pt reports pain in right foot.  Pain in right foot for 1 week.  States no injury.  Pt has swelling to right foot.  Painful to ambulate    pt taking meloxicam x 4 months without pain relief.

## 2016-02-22 ENCOUNTER — Emergency Department
Admission: EM | Admit: 2016-02-22 | Discharge: 2016-02-22 | Disposition: A | Discharge status: Home / Self Care | Payer: BLUE CROSS/BLUE SHIELD | Attending: Emergency Medicine | Admitting: Emergency Medicine

## 2016-02-22 ENCOUNTER — Emergency Department: Payer: BLUE CROSS/BLUE SHIELD

## 2016-02-22 DIAGNOSIS — M7989 Other specified soft tissue disorders: Secondary | ICD-10-CM

## 2016-02-22 DIAGNOSIS — M79604 Pain in right leg: Secondary | ICD-10-CM

## 2016-02-22 MED ORDER — TRAMADOL HCL 50 MG PO TABS
50.0000 mg | ORAL_TABLET | Freq: Once | ORAL | Status: AC
Start: 2016-02-22 — End: 2016-02-22
  Administered 2016-02-22: 50 mg via ORAL
  Filled 2016-02-22: qty 1

## 2016-02-22 MED ORDER — TRAMADOL HCL 50 MG PO TABS: 50.0000 mg | ORAL_TABLET | Freq: Four times a day (QID) | ORAL | Status: AC | PRN

## 2016-02-22 NOTE — ED Notes (Signed)
Pt had hernia repair surgery early in September, states the nerve was pulled over. Pt was seen by doctor in January for knee pain, negative x-ray. Now c/o of foot pain. Foot swollen, states limping. Pain 10/10 to walk. Pt states whole leg has been swollen. No swelling noted to lower leg at this time.

## 2016-02-22 NOTE — ED Notes (Signed)
E-signature not working, Systems analystrinted e-signature page and pt signed on paper.

## 2016-02-22 NOTE — ED Notes (Signed)
Pt sleeping on side on stretcher, snoring noted. Family at bedside.

## 2016-02-22 NOTE — Discharge Instructions (Signed)
Musculoskeletal Pain Musculoskeletal pain is muscle and boney aches and pains. These pains can occur in any part of the body. Your caregiver may treat you without knowing the cause of the pain. They may treat you if blood or urine tests, X-rays, and other tests were normal.  CAUSES There is often not a definite cause or reason for these pains. These pains may be caused by a type of germ (virus). The discomfort may also come from overuse. Overuse includes working out too hard when your body is not fit. Boney aches also come from weather changes. Bone is sensitive to atmospheric pressure changes. HOME CARE INSTRUCTIONS   Ask when your test results will be ready. Make sure you get your test results.  Only take over-the-counter or prescription medicines for pain, discomfort, or fever as directed by your caregiver. If you were given medications for your condition, do not drive, operate machinery or power tools, or sign legal documents for 24 hours. Do not drink alcohol. Do not take sleeping pills or other medications that may interfere with treatment.  Continue all activities unless the activities cause more pain. When the pain lessens, slowly resume normal activities. Gradually increase the intensity and duration of the activities or exercise.  During periods of severe pain, bed rest may be helpful. Lay or sit in any position that is comfortable.  Putting ice on the injured area.  Put ice in a bag.  Place a towel between your skin and the bag.  Leave the ice on for 15 to 20 minutes, 3 to 4 times a day.  Follow up with your caregiver for continued problems and no reason can be found for the pain. If the pain becomes worse or does not go away, it may be necessary to repeat tests or do additional testing. Your caregiver may need to look further for a possible cause. SEEK IMMEDIATE MEDICAL CARE IF:  You have pain that is getting worse and is not relieved by medications.  You develop chest pain  that is associated with shortness or breath, sweating, feeling sick to your stomach (nauseous), or throw up (vomit).  Your pain becomes localized to the abdomen.  You develop any new symptoms that seem different or that concern you. MAKE SURE YOU:   Understand these instructions.  Will watch your condition.  Will get help right away if you are not doing well or get worse.   This information is not intended to replace advice given to you by your health care provider. Make sure you discuss any questions you have with your health care provider.   Document Released: 10/09/2005 Document Revised: 01/01/2012 Document Reviewed: 06/13/2013 Elsevier Interactive Patient Education 2016 Elsevier Inc.  Evette Cristal Splints Shin splints is a painful condition that is felt on the bone that is located in the front of the lower leg (tibia or shin bone) or in the muscles on either side of the bone. This condition happens when physical activities lead to inflammation of the muscles, tendons, and the thin layer that covers the shin bone. It may result from participating in sports or other demanding exercise. CAUSES This condition may be caused by:  Overuse of muscles.  Repetitive activities.  Flat feet or rigid arches. Activities that could contribute to shin splints include:  Having a sudden increase in exercise time.  Starting a new, demanding activity.  Running up hills or long distances.  Playing sports that involve sudden starts and stops.  Using a poor warm-up.  Wearing old  or worn-out shoes. SYMPTOMS Symptoms of this condition include:  Pain on the front of the lower leg.  Pain while exercising or at rest. DIAGNOSIS This condition may be diagnosed based on a physical exam and the history of your symptoms. Your health care provider may observe you as you walk or run. You may also have X-rays or other imaging tests to rule out other problems that could cause lower leg pain, such as a stress  fracture. TREATMENT Treatment for this condition may depend on your age, history, health, and how bad the pain is. Most cases of shin splints can be managed by doing one or more of the following:  Resting.  Reducing the length and intensity of your exercise.  Stopping the activity that causes shin pain.  Taking medicines to control the inflammation.  Icing, massaging, stretching, and strengthening the affected area.  Wearing shoes that have rigid heels, shock absorption, and a good arch support. HOME CARE INSTRUCTIONS Injury Care  If directed, apply ice to the injured area:  Put ice in a plastic bag.  Place a towel between your skin and the bag.  Leave the ice on for 20 minutes, 2-3 times a day.  Massage, stretch, and strengthen the affected area as directed by your health care provider. Activity  Rest as needed. Return to activity gradually as directed by your health care provider.  Restart your exercise sessions with non-weight-bearing exercises, such as cycling or swimming.  Stop running if the pain returns.  Warm up properly before exercising.  Run on a surface that is level and fairly firm.  Gradually change the intensity of an exercise.  If you increase your running distance, add only 5-10% to your distance each week. This means that if you are running 5 miles this week, you should only increase your run by - mile for next week. General Instructions  Take medicines only as directed by your health care provider.  Wear shoes that have rigid heels, shock absorption, and a good arch support.  Change your athletic shoes every 6 months, or every 350-450 miles. SEEK MEDICAL CARE IF:  Your symptoms continue or they worsen even after treatment.  The location, intensity, or type of pain changes over time.  You have swelling in your lower leg that gets worse.  Your shin becomes red and feels warm. SEEK IMMEDIATE MEDICAL CARE IF:  You have severe pain.  You  have trouble walking.   This information is not intended to replace advice given to you by your health care provider. Make sure you discuss any questions you have with your health care provider.   Document Released: 10/06/2000 Document Revised: 02/23/2015 Document Reviewed: 10/05/2014 Elsevier Interactive Patient Education [employer]2016 Elsevier Inc.

## 2016-02-22 NOTE — ED Provider Notes (Signed)
[facility] Emergency Department Provider Note   ____________________________________________  Time seen: Approximately 140 AM  I have reviewed the triage vital signs and the nursing notes.   HISTORY  Chief Complaint Foot Pain    HPI Lawrence Ross is a 60 y.o. male who comes into the hospital today with pain in his foot and leg from the knee down. The patient reports that he's had pain from the knee to his lower leg for months but for the last 3 days his right foot has been hurting and swollen. The patient has been taking ibuprofen and meloxicam. He has seen his doctor and had a knee x-ray back in January. He reports he is also been to the [facility] weeks ago. He was told that he had inflammation in his leg and it felt tight but they didn't do anything for it. He was sent to physical therapy which she reports has not been helping. The patient reports he's had some knee pain since November but he has not gone to see orthopedic surgery. He rates his pain a 5 out of 10 in intensity and its pulsating on the top of his foot. He is concerned about a DVT and reports that when he was looking at pictures of DVT it look like his foot. The patient has not had an ultrasound at this time yet.   Past Medical History  Diagnosis Date  . Hypertension   . Diabetes mellitus without complication (HCC)     BORDERLINE  . Cold 06-23-15    INSTRUCTED TO CALL DR BYRNETTS OFFICE AND NOTIFY THEM OF THIS DUE TO UPCOMING SURGERY    Patient Active Problem List   Diagnosis Date Noted  . Right inguinal hernia 06/21/2015    Past Surgical History  Procedure Laterality Date  . No past surgeries    . Inguinal hernia repair Right 06/25/2015    Procedure: RIGHT INGUINAL HERNIA REPAIR ADULT;  Surgeon: Earline MayotteJeffrey W Byrnett, MD;  Location: [facility] ORS;  Service: General;  Laterality: Right;  . Hernia repair Right 06/25/2015    Indirect hernia repair with large Ultra Pro mesh    Current Outpatient Rx   Name  Route  Sig  Dispense  Refill  . amLODipine (NORVASC) 10 MG tablet   Oral   Take 10 mg by mouth every morning.       0   . bisoprolol (ZEBETA) 5 MG tablet   Oral   Take 5 mg by mouth every morning.       0   . diclofenac (VOLTAREN) 75 MG EC tablet   Oral   Take 75 mg by mouth as needed.      0   . Doxylamine-Phenylephrine-APAP (VICKS NYQUIL SINUS PO)   Oral   Take 1 tablet by mouth as needed.         . hydrochlorothiazide (HYDRODIURIL) 25 MG tablet   Oral   Take 25 mg by mouth daily.      0   . HYDROcodone-acetaminophen (NORCO) 5-325 MG per tablet   Oral   Take 1-2 tablets by mouth every 4 (four) hours as needed for moderate pain or severe pain.   30 tablet   0   . Pseudoeph-Doxylamine-DM-APAP (NYQUIL PO)   Oral   Take by mouth.         . traMADol (ULTRAM) 50 MG tablet   Oral   Take 1 tablet (50 mg total) by mouth every 6 (six) hours as needed.  12 tablet   0     Allergies Review of patient's allergies indicates no known allergies.  Family History  Problem Relation Age of Onset  . Diabetes Father   . Hypertension Father   . Fibromyalgia Mother   . Hypertension Mother   . Hypertension Brother     Social History Social History  Substance Use Topics  . Smoking status: Former Smoker -- 20 years    Types: Cigarettes    Quit date: 10/24/2003  . Smokeless tobacco: Never Used  . Alcohol Use: 0.0 oz/week    0 Standard drinks or equivalent per week     Comment: OCC    Review of Systems Constitutional: No fever/chills Eyes: No visual changes. ENT: No sore throat. Cardiovascular: Denies chest pain. Respiratory: Denies shortness of breath. Gastrointestinal: No abdominal pain.  No nausea, no vomiting.  No diarrhea.  No constipation. Genitourinary: Negative for dysuria. Musculoskeletal: Right leg and foot pain Skin: Negative for rash. Neurological: Negative for headaches, focal weakness or numbness.  10-point ROS otherwise  negative.  ____________________________________________   PHYSICAL EXAM:  VITAL SIGNS: ED Triage Vitals  Enc Vitals Group     BP 02/21/16 2309 183/91 mmHg     Pulse Rate 02/21/16 2309 70     Resp 02/21/16 2309 18     Temp 02/21/16 2309 98 F (36.7 C)     Temp Source 02/21/16 2309 Oral     SpO2 02/21/16 2309 99 %     Weight 02/21/16 2309 223 lb (101.152 kg)     Height 02/21/16 2309 5\' 8"  (1.727 m)     Head Cir --      Peak Flow --      Pain Score 02/21/16 2311 5     Pain Loc --      Pain Edu? --      Excl. in GC? --     Constitutional: Alert and oriented. Well appearing and in Mild distress. Eyes: Conjunctivae are normal. PERRL. EOMI. Head: Atraumatic. Nose: No congestion/rhinnorhea. Mouth/Throat: Mucous membranes are moist.  Oropharynx non-erythematous. Cardiovascular: Normal rate, regular rhythm. Grossly normal heart sounds.  Good peripheral circulation. Respiratory: Normal respiratory effort.  No retractions. Lungs CTAB. Gastrointestinal: Soft and nontender. No distention. Positive bowel sounds Musculoskeletal: No calf tenderness to palpation, no plantar tenderness to palpation, some soft tissue swelling of ankle and foot on the right with some mild erythema. Neurologic:  Normal speech and language.  Skin:  Skin is warm, dry and intact.  Psychiatric: Mood and affect are normal.   ____________________________________________   LABS (all labs ordered are listed, but only abnormal results are displayed)  Labs Reviewed - No data to display ____________________________________________  EKG  None ____________________________________________  RADIOLOGY  Right foot x-ray: Negative  Right lower extremity venous Doppler: No evidence of deep venous thrombosis. ____________________________________________   PROCEDURES  Procedure(s) performed: None  Critical Care performed: No  ____________________________________________   INITIAL IMPRESSION / ASSESSMENT  AND PLAN / ED COURSE  Pertinent labs & imaging results that were available during my care of the patient were reviewed by me and considered in my medical decision making (see chart for details).  This is a 60 year old male who comes into the hospital with months of leg pain and some swelling and pain to his right foot for the last 3 days. The patient had an x-ray that was negative and he had an ultrasound that was also negative. I did order some tramadol for the patient's pain. I encouraged him  to follow up with orthopedic surgery as he is been having this pain for months and it is not yet been resolved. The patient will be discharged home to follow-up with orthopedic surgery group. ____________________________________________   FINAL CLINICAL IMPRESSION(S) / ED DIAGNOSES  Final diagnoses:  Pain of right lower extremity  Foot swelling      NEW MEDICATIONS STARTED DURING THIS VISIT:  New Prescriptions   TRAMADOL (ULTRAM) 50 MG TABLET    Take 1 tablet (50 mg total) by mouth every 6 (six) hours as needed.     Note:  This document was prepared using Dragon voice recognition software and may include unintentional dictation errors.    Rebecka Apley, MD 02/22/16 [id number]

## 2017-10-30 IMAGING — US US EXTREM LOW VENOUS*R*
1 series · 13 of 24 positions shown · non-contrast
Comparison: None.

CLINICAL DATA: Right foot pain for 1 week.



[Series 1: us extrem low venous*right* · 0.07mm/px · 13 of 43 slices shown]
[im 1/43]
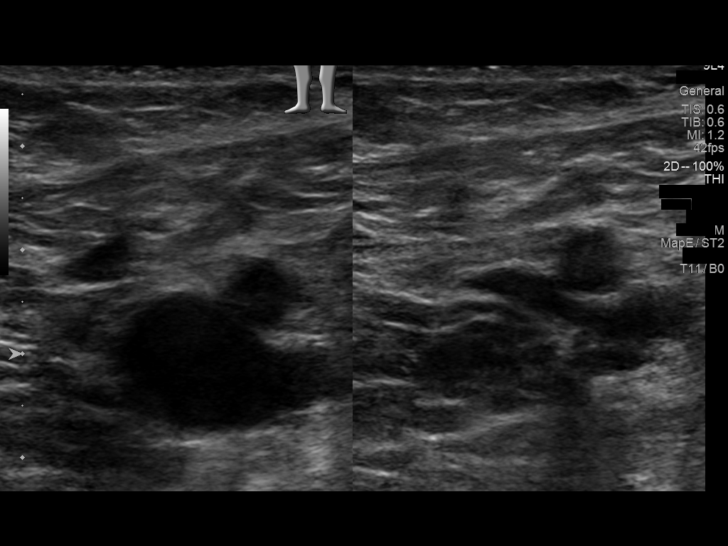
[im 4/43]
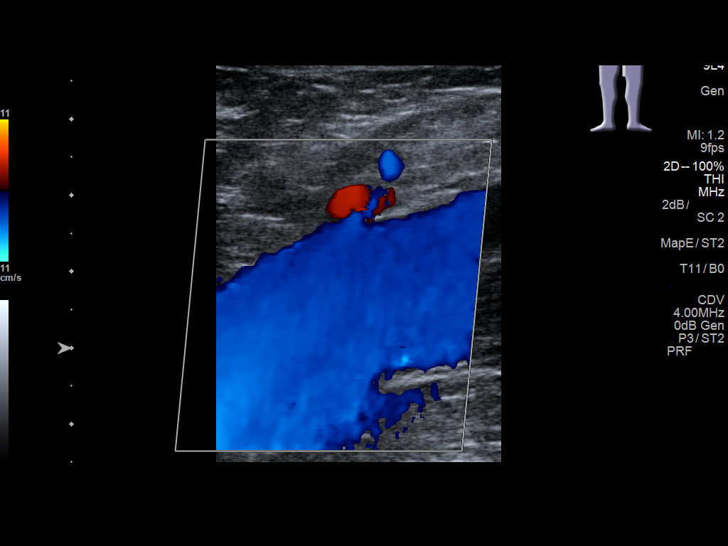
[im 8/43]
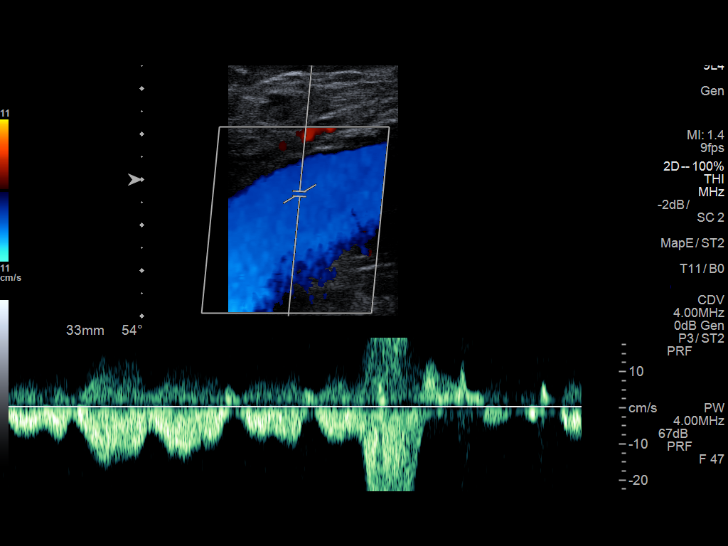
[im 11/43]
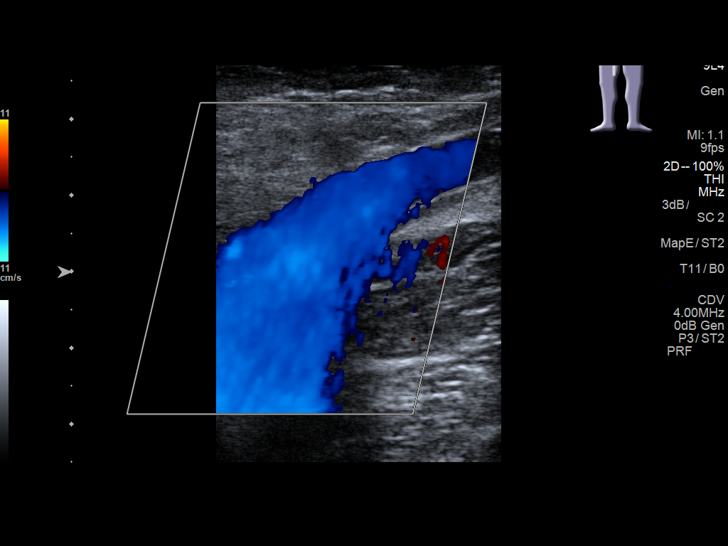
[im 15/43]
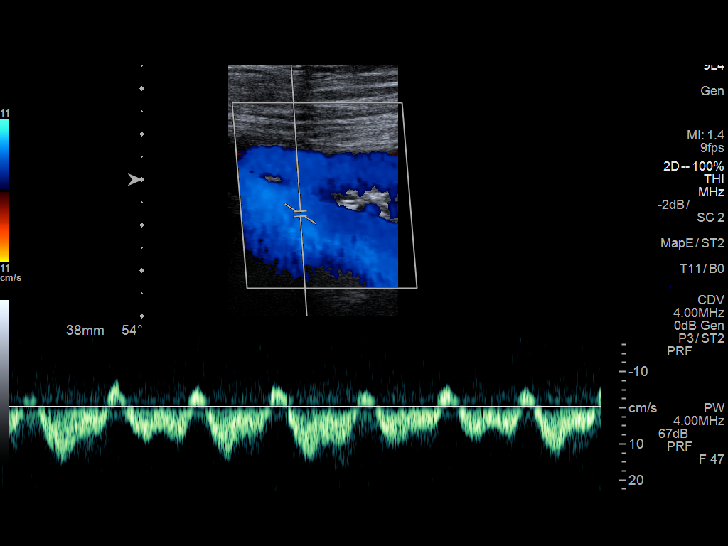
[im 19/43]
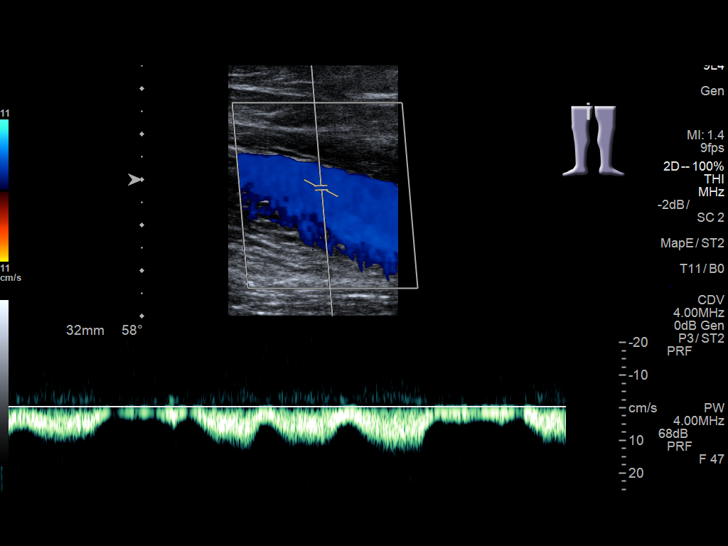
[im 22/43]
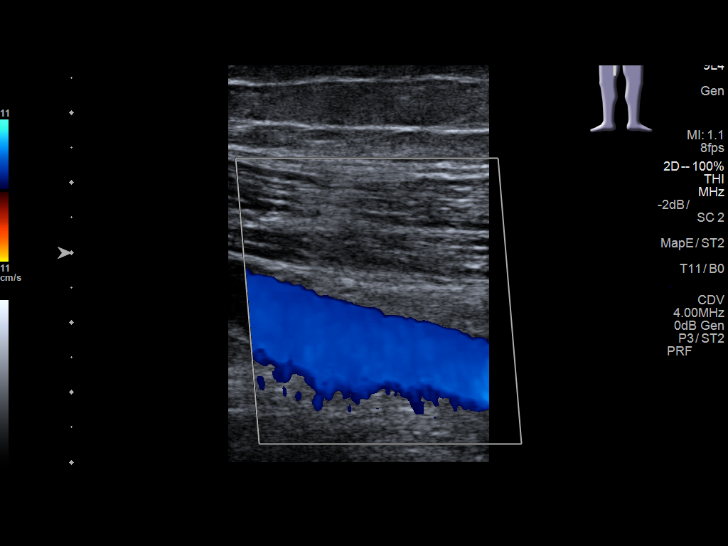
[im 24/43]
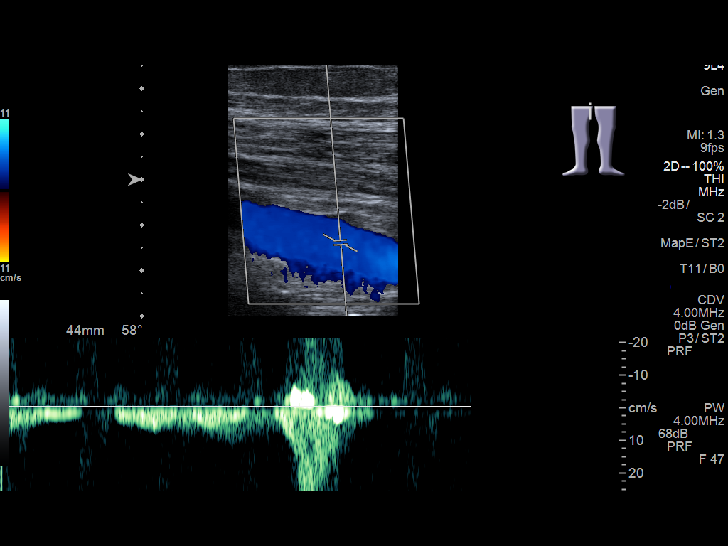
[im 28/43]
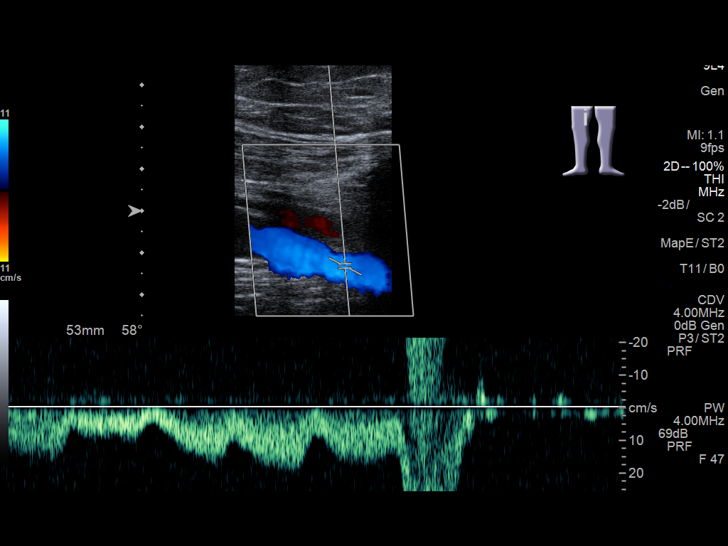
[im 32/43]
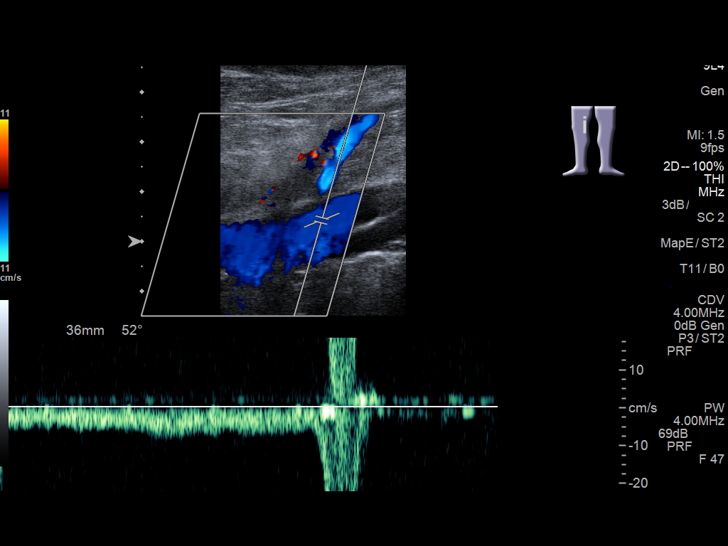
[im 35/43]
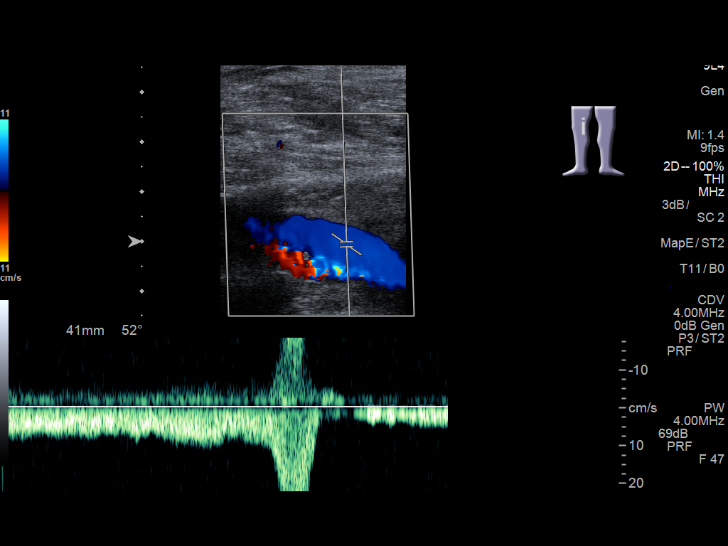
[im 39/43]
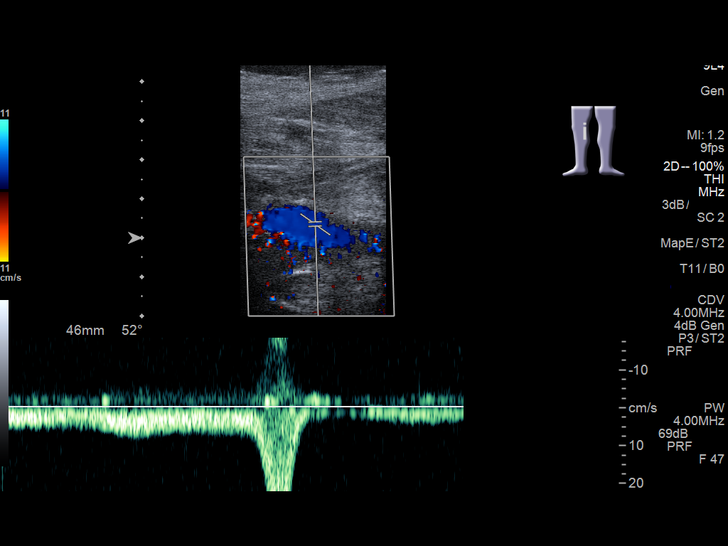
[im 43/43]
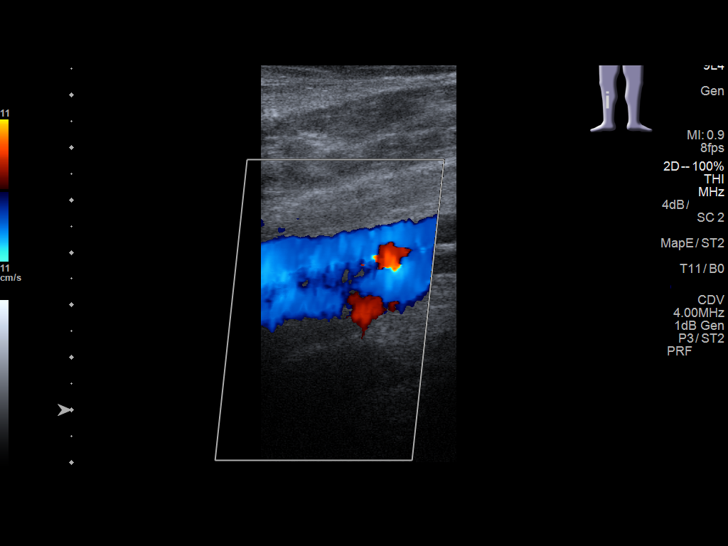

[13 of 24 positions shown; findings below may reference images not displayed]

FINDINGS: Contralateral Common Femoral Vein: Respiratory phasicity is normal
and symmetric with the symptomatic side. No evidence of thrombus.
Normal compressibility.

Common Femoral Vein: No evidence of thrombus. Normal
compressibility, respiratory phasicity and response to augmentation.

Saphenofemoral Junction: No evidence of thrombus. Normal
compressibility and flow on color Doppler imaging.

Profunda Femoral Vein: No evidence of thrombus. Normal
compressibility and flow on color Doppler imaging.

Femoral Vein: No evidence of thrombus. Normal compressibility,
respiratory phasicity and response to augmentation.

Popliteal Vein: No evidence of thrombus. Normal compressibility,
respiratory phasicity and response to augmentation.

Calf Veins: Non visible.

Superficial Great Saphenous Vein: No evidence of thrombus. Normal
compressibility and flow on color Doppler imaging.

Venous Reflux:  None.

Other Findings:  None.
IMPRESSION: No evidence of deep venous thrombosis.

## 2019-09-12 ENCOUNTER — Other Ambulatory Visit: Payer: Self-pay

## 2019-09-12 DIAGNOSIS — Z20822 Contact with and (suspected) exposure to covid-19: Secondary | ICD-10-CM

## 2019-09-15 LAB — NOVEL CORONAVIRUS, NAA: SARS-CoV-2, NAA: NOT DETECTED

## 2023-01-03 ENCOUNTER — Ambulatory Visit
Admission: RE | Admit: 2023-01-03 | Discharge: 2023-01-03 | Disposition: A | Discharge status: Home / Self Care | Payer: Medicare Other | Source: Ambulatory Visit | Attending: Family Medicine | Admitting: Family Medicine

## 2023-01-03 ENCOUNTER — Other Ambulatory Visit: Payer: Self-pay | Admitting: Family Medicine

## 2023-01-03 DIAGNOSIS — U071 COVID-19: Secondary | ICD-10-CM

## 2023-01-03 DIAGNOSIS — R079 Chest pain, unspecified: Secondary | ICD-10-CM | POA: Diagnosis not present

## 2024-10-30 ENCOUNTER — Ambulatory Visit: Admitting: Podiatry

## 2024-10-30 DIAGNOSIS — M7661 Achilles tendinitis, right leg: Secondary | ICD-10-CM | POA: Diagnosis not present

## 2024-10-30 MED ORDER — METHYLPREDNISOLONE 4 MG PO TBPK: ORAL_TABLET | ORAL | 0 refills | Status: AC

## 2024-10-30 MED ORDER — MELOXICAM 15 MG PO TABS: 15.0000 mg | ORAL_TABLET | Freq: Every day | ORAL | 0 refills | Status: AC

## 2024-10-30 NOTE — Progress Notes (Signed)
 "  Subjective:  Patient ID: Lawrence Ross, male    DOB: 01-Jul-1956,  MRN: [id number]  Chief Complaint  Patient presents with   Foot Pain    Right foot pain     69 y.o. male presents with the above complaint.  Patient presents with right Achilles tendon insertional pain painful to touch is progressing and worse worse with ambulation.  No symptoms presently denies any other acuteRelated [place].  Wanted Get It Evaluated.   Review of Systems: Negative except as noted in the HPI. Denies N/V/F/Ch.  Past Medical History:  Diagnosis Date   Cold 06-23-15   INSTRUCTED TO CALL DR BYRNETTS OFFICE AND NOTIFY THEM OF THIS DUE TO UPCOMING SURGERY   Diabetes mellitus without complication (HCC)    BORDERLINE   Hypertension    Current Medications[1]  Tobacco Use History[2]  Allergies[3] Objective:  There were no vitals filed for this visit. There is no height or weight on file to calculate BMI. Constitutional Well developed. Well nourished.  Vascular Dorsalis pedis pulses palpable bilaterally. Posterior tibial pulses palpable bilaterally. Capillary refill normal to all digits.  No cyanosis or clubbing noted. Pedal hair growth normal.  Neurologic Normal speech. Oriented to person, place, and time. Epicritic sensation to light touch grossly present bilaterally.  Dermatologic Nails well groomed and normal in appearance. No open wounds. No skin lesions.  Orthopedic: Pain on palpation to the right Achilles tendon insertion.  Positive Silfverskiold test with notable gastrocnemius equinus.  Pain on palpation.  Positive pain with dorsiflexion of the ankle joint no pain with plantarflexion of the ankle joint.  No pain at the posterior tibial tendon peroneal tendon ATFL ligament   Radiographs: None Assessment:   1. Right Achilles tendinitis    Plan:  Patient was evaluated and treated and all questions answered.  Right Achilles tendinitis - All questions and concerns were discussed with the  patient in extensive detail given the amount of pain that he is having he will benefit from cam boot immobilization he already has cam boot at home encouraged him to place himself in it for next 4 weeks he states understanding if there is no improvement we will discuss steroid injection versus an MRI.  No follow-ups on file.     [1]  Current Outpatient Medications:    meloxicam  (MOBIC ) 15 MG tablet, Take 1 tablet (15 mg total) by mouth daily., Disp: 30 tablet, Rfl: 0   methylPREDNISolone  (MEDROL  DOSEPAK) 4 MG TBPK tablet, Take as directed, Disp: 21 each, Rfl: 0   amLODipine (NORVASC) 10 MG tablet, Take 10 mg by mouth every morning. , Disp: , Rfl: 0   bisoprolol (ZEBETA) 5 MG tablet, Take 5 mg by mouth every morning. , Disp: , Rfl: 0   diclofenac (VOLTAREN) 75 MG EC tablet, Take 75 mg by mouth as needed., Disp: , Rfl: 0   Doxylamine-Phenylephrine-APAP (VICKS NYQUIL SINUS PO), Take 1 tablet by mouth as needed., Disp: , Rfl:    hydrochlorothiazide (HYDRODIURIL) 25 MG tablet, Take 25 mg by mouth daily., Disp: , Rfl: 0   HYDROcodone -acetaminophen  (NORCO) 5-325 MG per tablet, Take 1-2 tablets by mouth every 4 (four) hours as needed for moderate pain or severe pain., Disp: 30 tablet, Rfl: 0   Pseudoeph-Doxylamine-DM-APAP (NYQUIL PO), Take by mouth., Disp: , Rfl:    traMADol  (ULTRAM ) 50 MG tablet, Take 1 tablet (50 mg total) by mouth every 6 (six) hours as needed., Disp: 12 tablet, Rfl: 0 [2]  Social History Tobacco Use  Smoking  Status Former   Current packs/day: 0.00   Types: Cigarettes   Start date: 10/24/1983   Quit date: 10/24/2003   Years since quitting: 21.0  Smokeless Tobacco Never  [3] No Known Allergies  "

## 2024-11-27 ENCOUNTER — Ambulatory Visit: Admitting: Podiatry
